# Patient Record
Sex: Female | Born: 1970 | Race: White | Hispanic: No | Smoking: Current every day smoker
Health system: Southern US, Community
[De-identification: ages and names within clinical notes are randomized; demographics above are authoritative.]

## PROBLEM LIST (undated history)

## (undated) DIAGNOSIS — F909 Attention-deficit hyperactivity disorder, unspecified type: Secondary | ICD-10-CM

## (undated) DIAGNOSIS — I10 Essential (primary) hypertension: Secondary | ICD-10-CM

## (undated) HISTORY — PX: KNEE SURGERY: SHX244

---

## 2006-07-11 ENCOUNTER — Ambulatory Visit: Admission: RE | Admit: 2006-07-11 | Discharge: 2006-07-11 | Payer: Self-pay | Admitting: Obstetrics and Gynecology

## 2006-07-11 IMAGING — US US OB DETAIL+14 WK
1 series · 14 of 28 positions shown · non-contrast
Comparison: none

OBSTETRICAL ULTRASOUND:
 This ultrasound was performed in The [HOSPITAL], and the [facility] OB[facility] report will be stored to [REDACTED] PACS.

[Series 1: us ob detail+14 wk · 14 of 104 slices shown]
[im 4/104]
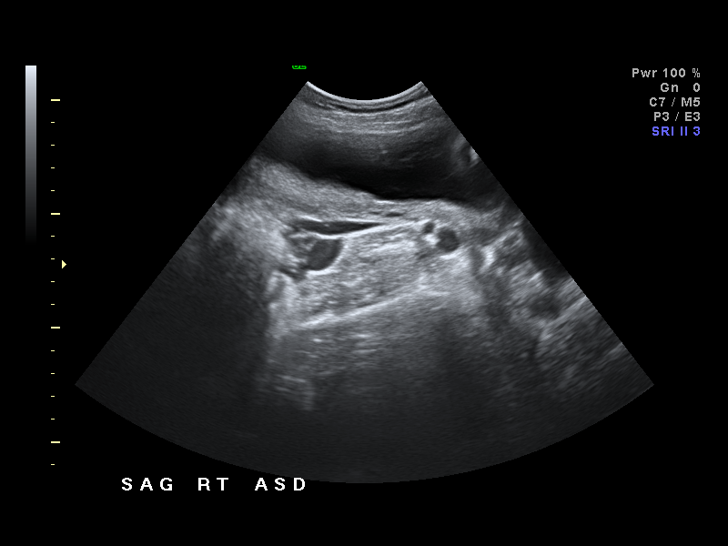
[im 12/104]
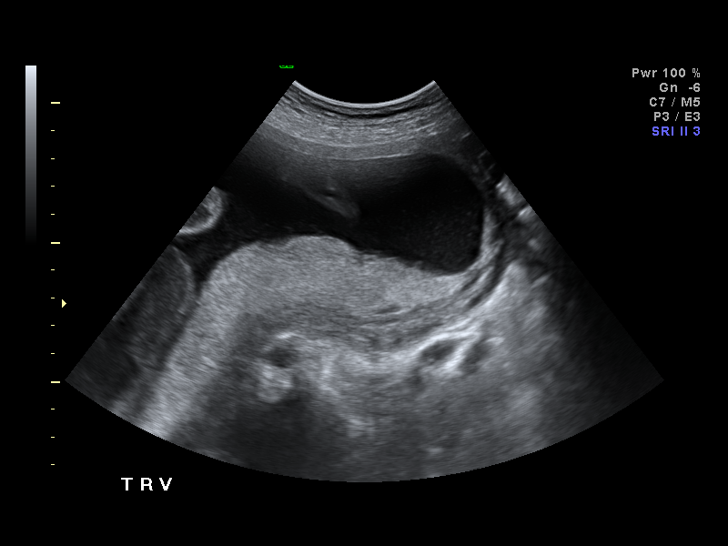
[im 20/104]
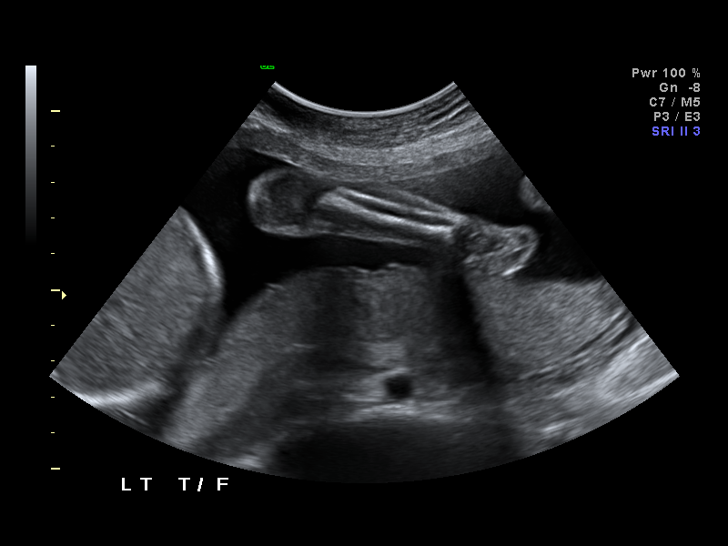
[im 27/104]
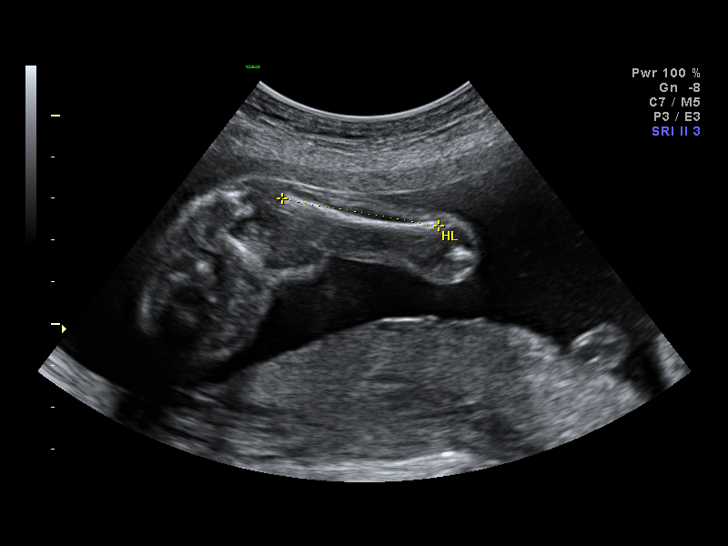
[im 35/104]
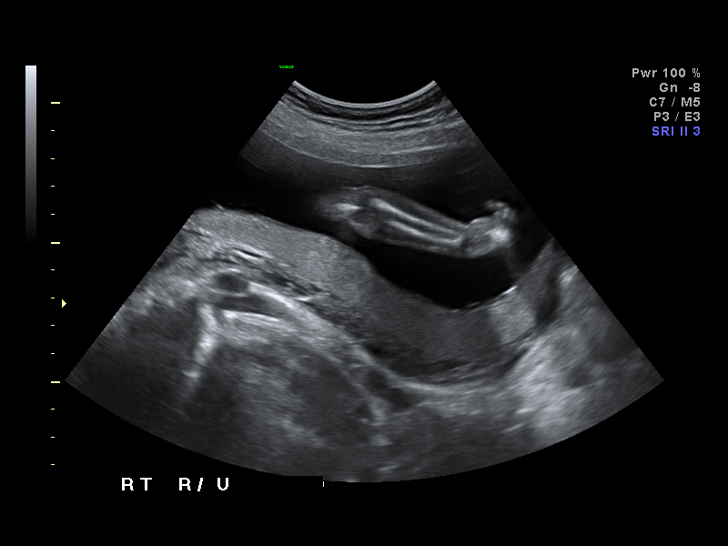
[im 42/104]
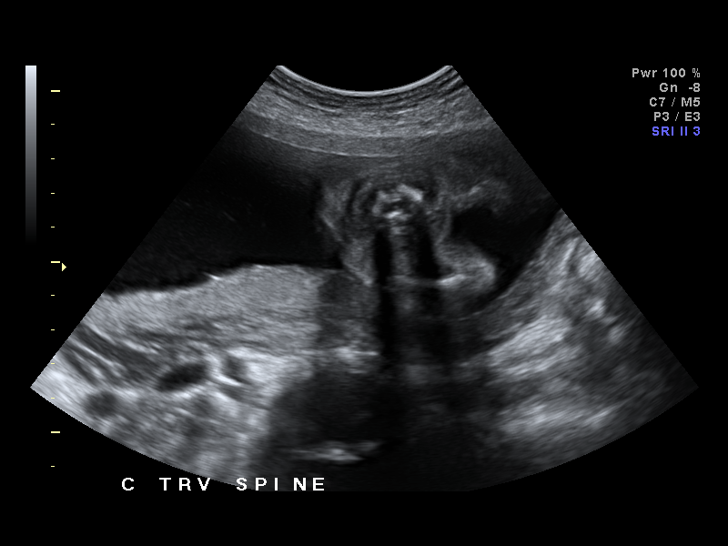
[im 50/104]
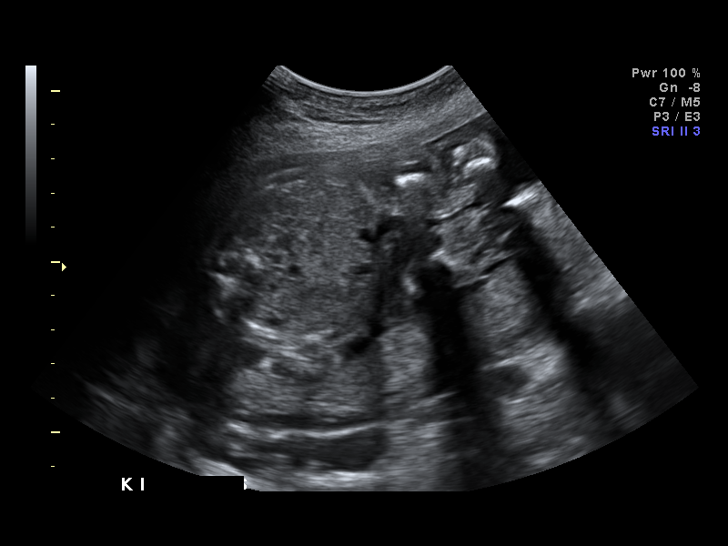
[im 58/104]
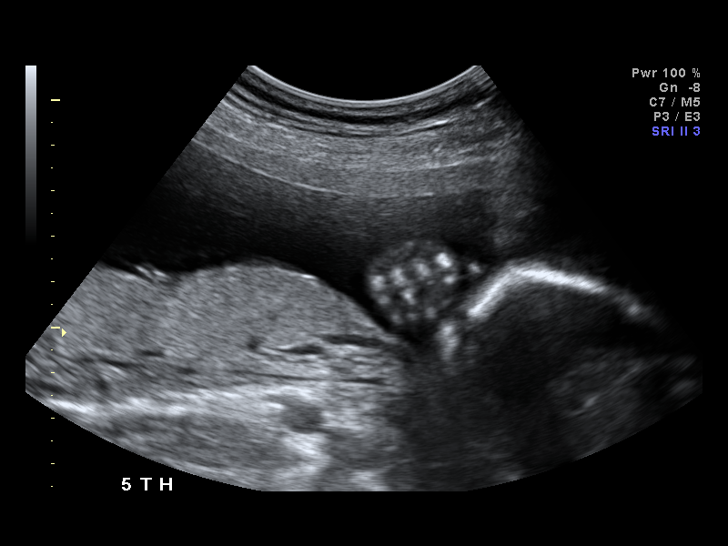
[im 65/104]
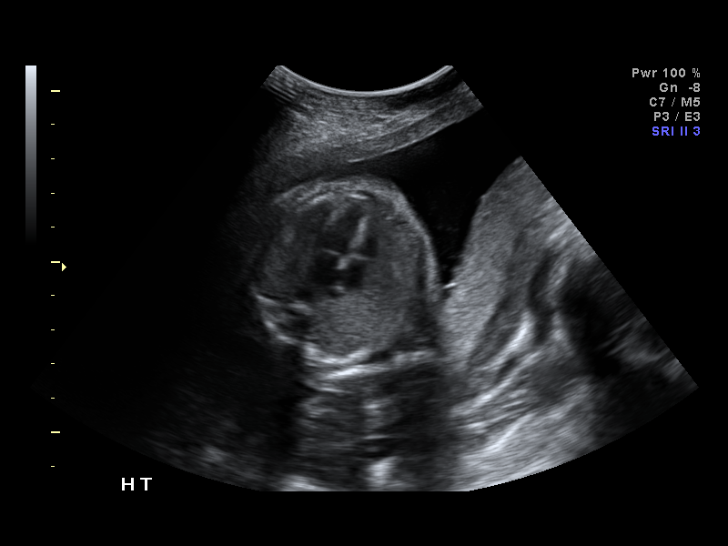
[im 73/104]
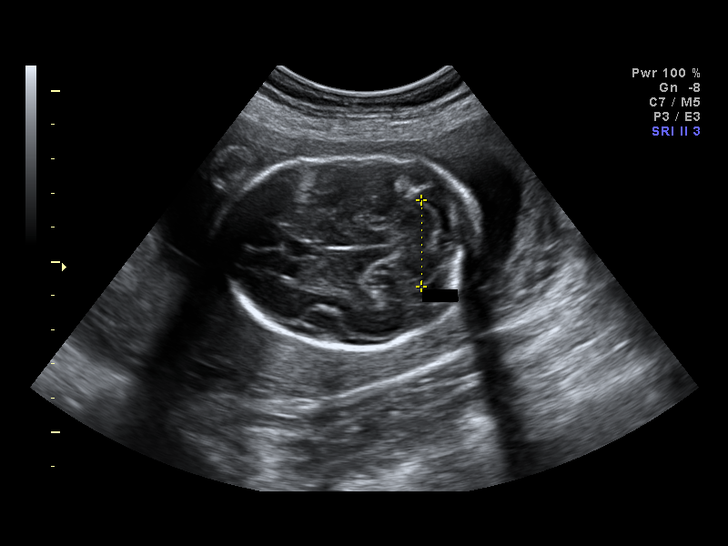
[im 81/104]
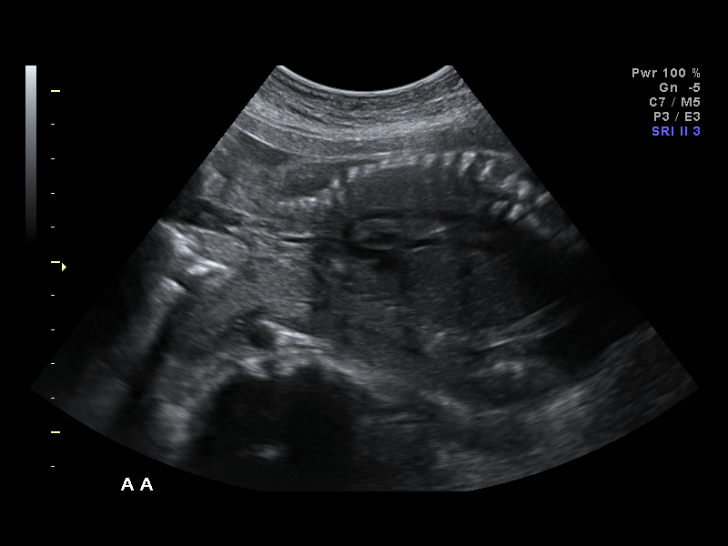
[im 88/104]
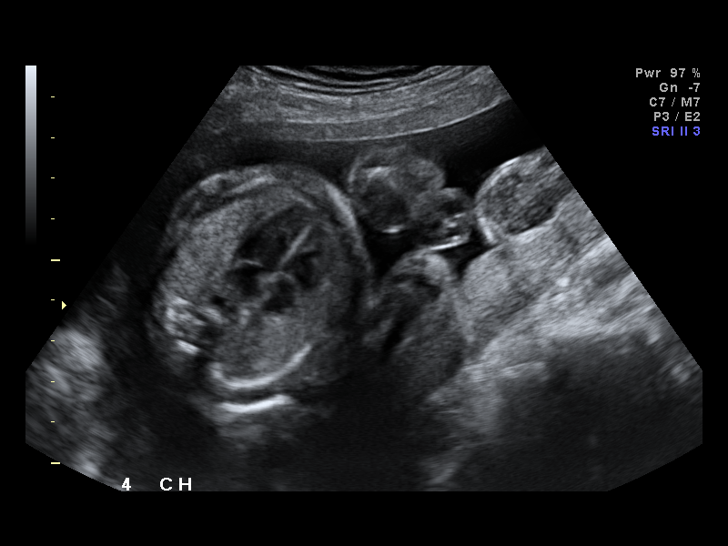
[im 96/104]
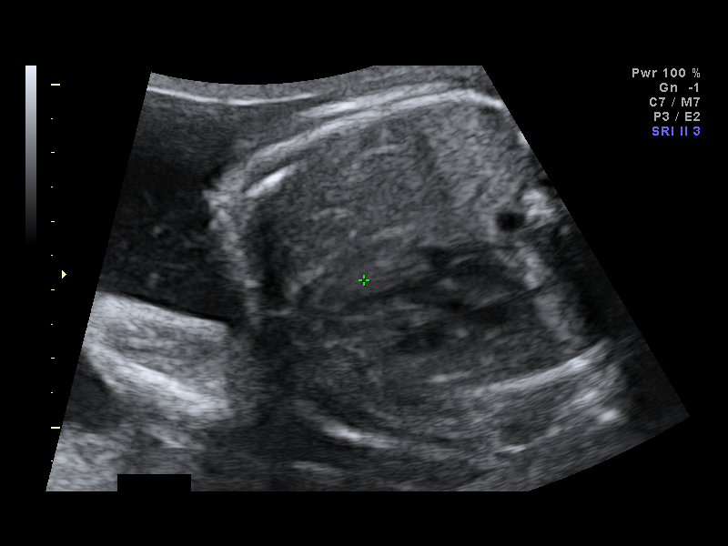
[im 104/104]
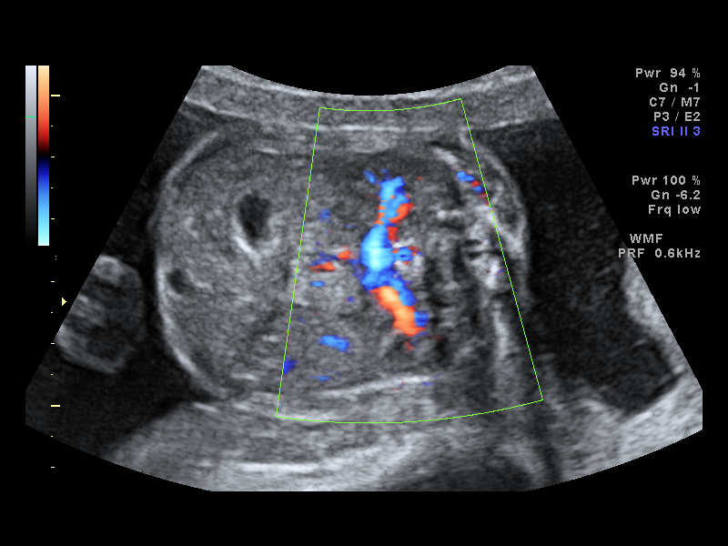

[14 of 28 positions shown; findings below may reference images not displayed]

IMPRESSION: The [facility] report has also been faxed to the ordering physician.

## 2006-09-12 ENCOUNTER — Ambulatory Visit (HOSPITAL_COMMUNITY): Admission: RE | Admit: 2006-09-12 | Discharge: 2006-09-12 | Payer: Self-pay | Admitting: Obstetrics and Gynecology

## 2013-07-10 ENCOUNTER — Emergency Department (HOSPITAL_COMMUNITY)
Admission: EM | Admit: 2013-07-10 | Discharge: 2013-07-11 | Disposition: A | Discharge status: Other Acute Inpatient Hospital | Payer: BC Managed Care – PPO | Attending: Emergency Medicine | Admitting: Emergency Medicine

## 2013-07-10 ENCOUNTER — Encounter (HOSPITAL_COMMUNITY): Payer: Self-pay | Admitting: Emergency Medicine

## 2013-07-10 DIAGNOSIS — R000 Tachycardia, unspecified: Secondary | ICD-10-CM | POA: Insufficient documentation

## 2013-07-10 DIAGNOSIS — R1110 Vomiting, unspecified: Secondary | ICD-10-CM | POA: Insufficient documentation

## 2013-07-10 DIAGNOSIS — F10229 Alcohol dependence with intoxication, unspecified: Secondary | ICD-10-CM | POA: Insufficient documentation

## 2013-07-10 DIAGNOSIS — Z975 Presence of (intrauterine) contraceptive device: Secondary | ICD-10-CM | POA: Insufficient documentation

## 2013-07-10 DIAGNOSIS — F411 Generalized anxiety disorder: Secondary | ICD-10-CM | POA: Insufficient documentation

## 2013-07-10 DIAGNOSIS — I10 Essential (primary) hypertension: Secondary | ICD-10-CM | POA: Insufficient documentation

## 2013-07-10 DIAGNOSIS — F10239 Alcohol dependence with withdrawal, unspecified: Secondary | ICD-10-CM

## 2013-07-10 DIAGNOSIS — Z9289 Personal history of other medical treatment: Secondary | ICD-10-CM

## 2013-07-10 DIAGNOSIS — R450 Nervousness: Secondary | ICD-10-CM | POA: Insufficient documentation

## 2013-07-10 DIAGNOSIS — F1010 Alcohol abuse, uncomplicated: Secondary | ICD-10-CM

## 2013-07-10 DIAGNOSIS — F10939 Alcohol use, unspecified with withdrawal, unspecified: Secondary | ICD-10-CM | POA: Insufficient documentation

## 2013-07-10 DIAGNOSIS — Z8659 Personal history of other mental and behavioral disorders: Secondary | ICD-10-CM | POA: Insufficient documentation

## 2013-07-10 HISTORY — DX: Essential (primary) hypertension: I10

## 2013-07-10 HISTORY — DX: Attention-deficit hyperactivity disorder, unspecified type: F90.9

## 2013-07-10 LAB — COMPREHENSIVE METABOLIC PANEL
ALT: 27 U/L (ref 0–35)
AST: 36 U/L (ref 0–37)
Albumin: 4.1 g/dL (ref 3.5–5.2)
Alkaline Phosphatase: 62 U/L (ref 39–117)
Calcium: 9 mg/dL (ref 8.4–10.5)
GFR calc Af Amer: 83 mL/min — ABNORMAL LOW (ref >90)
Potassium: 3.5 mEq/L (ref 3.5–5.1)
Sodium: 144 mEq/L (ref 135–145)
Total Protein: 7.2 g/dL (ref 6.0–8.3)

## 2013-07-10 LAB — ETHANOL: Alcohol, Ethyl (B): 394 mg/dL — ABNORMAL HIGH (ref 0–11)

## 2013-07-10 LAB — CBC WITH DIFFERENTIAL/PLATELET
Eosinophils Absolute: 0.2 10*3/uL (ref 0.0–0.7)
Lymphocytes Relative: 35 % (ref 12–46)
Lymphs Abs: 4.6 10*3/uL — ABNORMAL HIGH (ref 0.7–4.0)
MCH: 33.2 pg (ref 26.0–34.0)
Monocytes Relative: 4 % (ref 3–12)
Neutrophils Relative %: 60 % (ref 43–77)
Platelets: 238 10*3/uL (ref 150–400)
RBC: 4.46 MIL/uL (ref 3.87–5.11)
RDW: 14.9 % (ref 11.5–15.5)
WBC: 13.2 10*3/uL — ABNORMAL HIGH (ref 4.0–10.5)

## 2013-07-10 LAB — RAPID URINE DRUG SCREEN, HOSP PERFORMED
Amphetamines: NOT DETECTED
Benzodiazepines: NOT DETECTED
Cocaine: NOT DETECTED

## 2013-07-10 NOTE — ED Provider Notes (Signed)
Medical screening examination/treatment/procedure(s) were performed by non-physician practitioner and as supervising physician I was immediately available for consultation/collaboration.  EKG Interpretation   None         Gilda Crease, MD 07/10/13 2221

## 2013-07-10 NOTE — ED Notes (Signed)
Patient resting in position of comfort with eyes closed RR WNL--even and unlabored with equal rise and fall of chest Patient in NAD Side rails up, call bell in reach  

## 2013-07-10 NOTE — ED Notes (Signed)
Pt's boyfriend will be taking pt's belonging home with him

## 2013-07-10 NOTE — ED Notes (Signed)
Pt brought in by boyfriend. Pt requesting EtOH detox. Pt states that she has been drinking since teen years. Pt tearful. Per family pt has been binge drinking for last 5 days straight and has not been to work. Family found pt in motel room with vomit and fifth of vodka that was empty.
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
## 2013-07-10 NOTE — ED Notes (Signed)
Patient's boyfriend states that he is going home for the rest of the night Boyfriend took patient's belongings with him

## 2013-07-10 NOTE — ED Notes (Signed)
Assumed care of patient Patient here for ETOH detox, brought in by boyfriend who is at the bedside Patient has had issue with drinking since teenager Patient has attempted to detox before without success--NO hx of seizures when detox attempted previously Patient too intoxicated to ambulate to bathroom--placed on bedpan, urine specimen obtained Patient appears in NAD Call bell in reach and boyfriend remains at bedside

## 2013-07-11 ENCOUNTER — Encounter (HOSPITAL_COMMUNITY): Payer: Self-pay | Admitting: *Deleted

## 2013-07-11 ENCOUNTER — Encounter (HOSPITAL_COMMUNITY): Payer: Self-pay | Admitting: Emergency Medicine

## 2013-07-11 ENCOUNTER — Inpatient Hospital Stay (HOSPITAL_COMMUNITY)
Admission: AD | Admit: 2013-07-11 | Discharge: 2013-07-13 | DRG: 897 | Disposition: A | Discharge status: Home / Self Care | Payer: Federal, State, Local not specified - Other | Source: Intra-hospital | Attending: Psychiatry | Admitting: Psychiatry

## 2013-07-11 DIAGNOSIS — Z79899 Other long term (current) drug therapy: Secondary | ICD-10-CM

## 2013-07-11 DIAGNOSIS — F1010 Alcohol abuse, uncomplicated: Secondary | ICD-10-CM

## 2013-07-11 DIAGNOSIS — F909 Attention-deficit hyperactivity disorder, unspecified type: Secondary | ICD-10-CM | POA: Diagnosis present

## 2013-07-11 DIAGNOSIS — F10939 Alcohol use, unspecified with withdrawal, unspecified: Principal | ICD-10-CM | POA: Diagnosis present

## 2013-07-11 DIAGNOSIS — F1020 Alcohol dependence, uncomplicated: Secondary | ICD-10-CM | POA: Diagnosis present

## 2013-07-11 DIAGNOSIS — F329 Major depressive disorder, single episode, unspecified: Secondary | ICD-10-CM

## 2013-07-11 DIAGNOSIS — I10 Essential (primary) hypertension: Secondary | ICD-10-CM | POA: Diagnosis present

## 2013-07-11 DIAGNOSIS — F10239 Alcohol dependence with withdrawal, unspecified: Principal | ICD-10-CM | POA: Diagnosis present

## 2013-07-11 MED ORDER — LOPERAMIDE HCL 2 MG PO CAPS
2.0000 mg | ORAL_CAPSULE | ORAL | Status: DC | PRN
  Filled 2013-07-11: qty 1

## 2013-07-11 MED ORDER — LORAZEPAM 1 MG PO TABS: 0.0000 mg | ORAL_TABLET | Freq: Two times a day (BID) | ORAL | Status: DC

## 2013-07-11 MED ORDER — NICOTINE 14 MG/24HR TD PT24
14.0000 mg | MEDICATED_PATCH | Freq: Every day | TRANSDERMAL | Status: DC
  Administered 2013-07-11 – 2013-07-12 (×2): 14 mg via TRANSDERMAL
  Filled 2013-07-11 (×5): qty 1

## 2013-07-11 MED ORDER — CHLORDIAZEPOXIDE HCL 25 MG PO CAPS
25.0000 mg | ORAL_CAPSULE | Freq: Four times a day (QID) | ORAL | Status: DC
  Administered 2013-07-11 (×2): 25 mg via ORAL
  Filled 2013-07-11 (×2): qty 1

## 2013-07-11 MED ORDER — CHLORDIAZEPOXIDE HCL 25 MG PO CAPS: 25.0000 mg | ORAL_CAPSULE | Freq: Every day | ORAL | Status: DC

## 2013-07-11 MED ORDER — BISOPROLOL-HYDROCHLOROTHIAZIDE 2.5-6.25 MG PO TABS
1.0000 | ORAL_TABLET | Freq: Every day | ORAL | Status: DC
  Administered 2013-07-13: 1 via ORAL
  Filled 2013-07-11: qty 5
  Filled 2013-07-11 (×3): qty 1

## 2013-07-11 MED ORDER — VITAMIN B-1 100 MG PO TABS: 100.0000 mg | ORAL_TABLET | Freq: Every day | ORAL | Status: DC

## 2013-07-11 MED ORDER — LOPERAMIDE HCL 2 MG PO CAPS: 2.0000 mg | ORAL_CAPSULE | ORAL | Status: DC | PRN

## 2013-07-11 MED ORDER — MAGNESIUM HYDROXIDE 400 MG/5ML PO SUSP: 30.0000 mL | Freq: Every day | ORAL | Status: DC | PRN

## 2013-07-11 MED ORDER — THIAMINE HCL 100 MG/ML IJ SOLN
100.0000 mg | Freq: Once | INTRAMUSCULAR | Status: AC
  Administered 2013-07-11: 200 mg via INTRAMUSCULAR
  Filled 2013-07-11: qty 2

## 2013-07-11 MED ORDER — NICOTINE 21 MG/24HR TD PT24
21.0000 mg | MEDICATED_PATCH | Freq: Every day | TRANSDERMAL | Status: DC
  Filled 2013-07-11: qty 1

## 2013-07-11 MED ORDER — LORAZEPAM 1 MG PO TABS: 0.0000 mg | ORAL_TABLET | Freq: Four times a day (QID) | ORAL | Status: DC

## 2013-07-11 MED ORDER — CHLORDIAZEPOXIDE HCL 25 MG PO CAPS
25.0000 mg | ORAL_CAPSULE | Freq: Three times a day (TID) | ORAL | Status: DC
  Filled 2013-07-11 (×2): qty 1

## 2013-07-11 MED ORDER — CHLORDIAZEPOXIDE HCL 25 MG PO CAPS: 25.0000 mg | ORAL_CAPSULE | ORAL | Status: DC

## 2013-07-11 MED ORDER — ADULT MULTIVITAMIN W/MINERALS CH
1.0000 | ORAL_TABLET | Freq: Every day | ORAL | Status: DC
  Administered 2013-07-12 – 2013-07-13 (×2): 1 via ORAL
  Filled 2013-07-11 (×4): qty 1

## 2013-07-11 MED ORDER — HYDROXYZINE HCL 25 MG PO TABS
25.0000 mg | ORAL_TABLET | Freq: Four times a day (QID) | ORAL | Status: DC | PRN
  Administered 2013-07-12: 25 mg via ORAL
  Filled 2013-07-11 (×2): qty 1

## 2013-07-11 MED ORDER — VITAMIN B-1 100 MG PO TABS
100.0000 mg | ORAL_TABLET | Freq: Every day | ORAL | Status: DC
  Administered 2013-07-12 – 2013-07-13 (×2): 100 mg via ORAL
  Filled 2013-07-11 (×4): qty 1

## 2013-07-11 MED ORDER — ACETAMINOPHEN 325 MG PO TABS
650.0000 mg | ORAL_TABLET | Freq: Four times a day (QID) | ORAL | Status: DC | PRN
  Administered 2013-07-11: 650 mg via ORAL
  Filled 2013-07-11: qty 2

## 2013-07-11 MED ORDER — ONDANSETRON 4 MG PO TBDP: 4.0000 mg | ORAL_TABLET | Freq: Four times a day (QID) | ORAL | Status: DC | PRN

## 2013-07-11 MED ORDER — CHLORDIAZEPOXIDE HCL 25 MG PO CAPS
25.0000 mg | ORAL_CAPSULE | Freq: Four times a day (QID) | ORAL | Status: DC | PRN
  Administered 2013-07-11: 25 mg via ORAL
  Filled 2013-07-11: qty 1

## 2013-07-11 MED ORDER — CHLORDIAZEPOXIDE HCL 25 MG PO CAPS
25.0000 mg | ORAL_CAPSULE | Freq: Four times a day (QID) | ORAL | Status: AC
  Administered 2013-07-11 – 2013-07-12 (×3): 25 mg via ORAL
  Filled 2013-07-11 (×4): qty 1

## 2013-07-11 MED ORDER — ALUM & MAG HYDROXIDE-SIMETH 200-200-20 MG/5ML PO SUSP: 30.0000 mL | ORAL | Status: DC | PRN

## 2013-07-11 MED ORDER — HYDROXYZINE HCL 25 MG PO TABS
25.0000 mg | ORAL_TABLET | Freq: Four times a day (QID) | ORAL | Status: DC | PRN
  Administered 2013-07-11: 25 mg via ORAL
  Filled 2013-07-11: qty 1

## 2013-07-11 MED ORDER — ADULT MULTIVITAMIN W/MINERALS CH
1.0000 | ORAL_TABLET | Freq: Every day | ORAL | Status: DC
  Administered 2013-07-11: 1 via ORAL
  Filled 2013-07-11: qty 1

## 2013-07-11 MED ORDER — ONDANSETRON 4 MG PO TBDP
4.0000 mg | ORAL_TABLET | Freq: Four times a day (QID) | ORAL | Status: DC | PRN
  Filled 2013-07-11: qty 1

## 2013-07-11 MED ORDER — NICOTINE 21 MG/24HR TD PT24
21.0000 mg | MEDICATED_PATCH | Freq: Every day | TRANSDERMAL | Status: DC
  Administered 2013-07-11: 21 mg via TRANSDERMAL
  Filled 2013-07-11: qty 1

## 2013-07-11 MED ORDER — CHLORDIAZEPOXIDE HCL 25 MG PO CAPS
50.0000 mg | ORAL_CAPSULE | Freq: Once | ORAL | Status: AC
  Administered 2013-07-11: 50 mg via ORAL
  Filled 2013-07-11: qty 2

## 2013-07-11 MED ORDER — ACETAMINOPHEN 325 MG PO TABS: 650.0000 mg | ORAL_TABLET | Freq: Four times a day (QID) | ORAL | Status: DC | PRN

## 2013-07-11 MED ORDER — CHLORDIAZEPOXIDE HCL 25 MG PO CAPS: 25.0000 mg | ORAL_CAPSULE | Freq: Four times a day (QID) | ORAL | Status: DC | PRN

## 2013-07-11 MED ORDER — BISOPROLOL-HYDROCHLOROTHIAZIDE 2.5-6.25 MG PO TABS
1.0000 | ORAL_TABLET | Freq: Every day | ORAL | Status: DC
  Administered 2013-07-11: 1 via ORAL
  Filled 2013-07-11: qty 1

## 2013-07-11 MED ORDER — CHLORDIAZEPOXIDE HCL 25 MG PO CAPS: 25.0000 mg | ORAL_CAPSULE | Freq: Three times a day (TID) | ORAL | Status: DC
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
## 2013-07-11 NOTE — ED Notes (Signed)
Patient resting in position of comfort with eyes closed RR WNL--even and unlabored with equal rise and fall of chest Patient in NAD Side rails up, call bell in reach  

## 2013-07-11 NOTE — ED Notes (Signed)
Notified [facility] of transportation needed to [facility]. 

## 2013-07-11 NOTE — Tx Team (Signed)
Initial Interdisciplinary Treatment Plan  PATIENT STRENGTHS: (choose at least two) Ability for insight Average or above average intelligence Capable of independent living General fund of knowledge Physical Health Religious Affiliation Supportive family/friends Work skills  PATIENT STRESSORS: Substance abuse   PROBLEM LIST: Problem List/Patient Goals Date to be addressed Date deferred Reason deferred Estimated date of resolution  Substance abuse 07/11/13                                                      DISCHARGE CRITERIA:  Improved stabilization in mood, thinking, and/or behavior Medical problems require only outpatient monitoring Motivation to continue treatment in a less acute level of care Need for constant or close observation no longer present Verbal commitment to aftercare and medication compliance Withdrawal symptoms are absent or subacute and managed without 24-hour nursing intervention  PRELIMINARY DISCHARGE PLAN: Attend aftercare/continuing care group Attend 12-step recovery group Return to previous living arrangement Return to previous work or school arrangements  PATIENT/FAMIILY INVOLVEMENT: This treatment plan has been presented to and reviewed with the patient, Carla Caldwell, and/or family member,   The patient and family have been given the opportunity to ask questions and make suggestions.  Beatrix Shipper 07/11/2013, 5:35 PM

## 2013-07-11 NOTE — ED Notes (Signed)
Patient tearful and anxious. Denies SI, HI, AVH. C/o mild nausea and headache, anxiety and agitation 10/10, and slight chill from sweat. Patient states "I am angry at myself but I would not kill myself, I love my child". Patient concerned about her child getting to school. Patient agitated that she cannot call her boyfriend. Refuses to sign belongings form because she cannot remember her boyfriend leaving with her belongings.  Carla Caldwell has patient's child. Patient's boyfriend Carla Caldwell [phone] has contact information for [name].  Patient remains tearful. Encouragement offered.   Patient safety maintained. Q 15 minute checks continue.

## 2013-07-11 NOTE — Progress Notes (Signed)
Ms. Rosendo Gros [phone].

## 2013-07-11 NOTE — ED Notes (Signed)
Patient awake and upset, crying--anxious that her boyfriend left and went home while she was asleep Patient asking about belongings--doesn't remember that her boyfriend took her belongings home with him Patient reminded that her boyfriend has her belongings Patient stated "I want my boyfriend back here. I need him here. I want my belongings." Patient informed that even if her boyfriend did return to ED with her belongings, that per policy, she would not be allowed to have them at bedside  Patient then stated "I don't care. I want my boyfriend to come back. I need him to come back." Will inform MD and charge nurse of above

## 2013-07-11 NOTE — ED Notes (Signed)
Patient awake--given ice water, sandwich, cheese and apple sauce Patient denies c/o pain or further needs at this time

## 2013-07-11 NOTE — ED Notes (Signed)
[facility] here to transport to [facility]. No complaints voiced, denies pain. No belongings. Mental health tech escorted to [facility].

## 2013-07-11 NOTE — Consult Note (Signed)
  Psychiatric Specialty Exam: Physical Exam  ROS  Blood pressure 120/71, pulse 101, temperature 97.9 F (36.6 C), temperature source Oral, resp. rate 20, last menstrual period 06/26/2013, SpO2 97.00%.There is no height or weight on file to calculate BMI.  General Appearance: Fairly Groomed  Patent attorney::  Good  Speech:  Clear and Coherent  Volume:  Normal  Mood:  Depressed  Affect:  Appropriate  Thought Process:  Coherent and Logical  Orientation:  Full (Time, Place, and Person)  Thought Content:  Negative  Suicidal Thoughts:  No  Homicidal Thoughts:  No  Memory:  Immediate;   Good Recent;   Good Remote;   Good  Judgement:  Good  Insight:  Good  Psychomotor Activity:  Normal  Concentration:  Good  Recall:  Good  Akathisia:  Negative  Handed:  Right  AIMS (if indicated):     Assets:  Communication Skills Desire for Improvement Financial Resources/Insurance Housing Leisure Time Physical Health Social Support Talents/Skills Transportation Vocational/Educational  Sleep:   adequate  Carla Caldwell says she is a binge drinker.  She has been drinking 2 weeks this time, more for the last couple of days.  She is depressed but does not want to take anti-depressants as the side effects are worse than the depression, she says.  She was going to AA but said it was a trigger for drinking, she said.  She does see a therapist.  Recommend transfer to an inpatient bed for alcohol detox and treatment of depression.

## 2013-07-11 NOTE — ED Notes (Signed)
Patient to go to Psych ED Will call report and move patient
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
## 2013-07-11 NOTE — ED Notes (Signed)
NP at bedside to speak with patient

## 2013-07-11 NOTE — [facility] Counselor (Signed)
Per Tanna Savoy at [facility], pt has been accepted to [facility] to [place].   Evette Cristal, [place] Assessment Counselor

## 2013-07-11 NOTE — Progress Notes (Signed)
Adult Psychoeducational Group Note  Date:  07/11/2013 Time:  9:57 PM  Group Topic/Focus:  NA MEETING  Participation Level:  Active  Participation Quality:  Appropriate  Affect:  Appropriate  Cognitive:  Appropriate  Insight: Appropriate  Engagement in Group:  Engaged  Modes of Intervention:  Support  Additional Comments:  PT attended and participated in NA meeting   Rajah Tagliaferro 07/11/2013, 9:57 PM

## 2013-07-12 ENCOUNTER — Encounter (HOSPITAL_COMMUNITY): Payer: Self-pay | Admitting: Psychiatry

## 2013-07-12 DIAGNOSIS — F321 Major depressive disorder, single episode, moderate: Secondary | ICD-10-CM

## 2013-07-12 DIAGNOSIS — F411 Generalized anxiety disorder: Secondary | ICD-10-CM

## 2013-07-12 DIAGNOSIS — F1020 Alcohol dependence, uncomplicated: Secondary | ICD-10-CM

## 2013-07-12 MED ORDER — NICOTINE POLACRILEX 2 MG MT GUM
2.0000 mg | CHEWING_GUM | OROMUCOSAL | Status: DC | PRN
  Administered 2013-07-12 – 2013-07-13 (×2): 2 mg via ORAL
  Filled 2013-07-12 (×2): qty 1

## 2013-07-12 NOTE — Progress Notes (Signed)
Recreation Therapy Notes  Date: 12.04.2014 Time: 3:00pm Location: [address] Dayroom   Group Topic: Communication, Team Building, Problem Solving  Goal Area(s) Addresses:  Patient will effectively work with peer towards shared goal.  Patient will identify skill used to make activity successful.  Patient will identify how skills used during activity can be used to reach post d/c goals.   Behavioral Response: Engaged, Attentive, Appropriate   Intervention: Problem Solving Activitiy  Activity: Life Boat. Patients were given a scenario about being on a sinking yacht. Patients were informed the yacht included 15 guest, 8 of which could be placed on the life boat, along with all group members. Individuals on guest list were of varying socioeconomic classes such as a Education officer, museum, Materials engineer, Midwife, Tree surgeon.   Education: Special educational needs teacher, Team Work, Scientist, physiological, Journalist, newspaper, Discharge Planning   Education Outcome: Acknowledges understanding  Clinical Observations/Feedback: Patient actively engaged in group activity, voicing her opinion and debating with group members appropriately. Patient identified qualities that guided her decision making and related theses skills to her recovery process. Patient additionally group skills used, problem solving and communication and identify how each of these skills will be useful during her recovery process.   Marykay Lex Yasha Tibbett, LRT/CTRS  Jearl Klinefelter 07/12/2013 3:55 PM

## 2013-07-12 NOTE — Progress Notes (Signed)
Adult Psychoeducational Group Note  Date:  07/12/2013 Time:  7:16 PM  Group Topic/Focus:  Overcoming Stress:   The focus of this group is to define stress and help patients assess their triggers.  Participation Level:  Active  Participation Quality:  Appropriate  Affect:  Appropriate  Cognitive:  Alert and Appropriate  Insight: Appropriate and Good  Engagement in Group:  Engaged  Modes of Intervention:  Confrontation, Education, Socialization and Support  Additional Comments:  Pt was very active and verbal in the stress group discussion.  Reynolds Bowl 07/12/2013, 7:16 PM
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
## 2013-07-12 NOTE — [facility] Group Notes (Signed)
[facility] LCSW Group Therapy  07/12/2013  1:15 PM   Type of Therapy:  Group Therapy  Participation Level:  Active  Participation Quality:  Attentive, Sharing and Supportive  Affect:  Calm and Appropriate  Cognitive:  Alert and Oriented  Insight:  Developing/Improving and Engaged  Engagement in Therapy:  Developing/Improving and Engaged  Modes of Intervention:  Clarification, Confrontation, Discussion, Education, Exploration, Limit-setting, Orientation, Problem-solving, Rapport Building, Dance movement psychotherapist, Socialization and Support  Summary of Progress/Problems: The topic for group was balance in life.  Today's group focused on defining balance in one's own words, identifying things that can knock one off balance, and exploring healthy ways to maintain balance in life. Group members were asked to provide an example of a time when they felt off balance, describe how they handled that situation,and process healthier ways to regain balance in the future. Group members were asked to share the most important tool for maintaining balance that they learned while at [facility] and how they plan to apply this method after discharge.  Pt shared that a balanced life for her means to be able to do everything and not get overwhelmed, but still have time for herself.  Pt participated in discussion of feeling guilty for taking "me time".  Pt shared that her life becomes unbalanced when she can't do everything and binge drinks to cope.  Pt states that she plans to attend AA meetings and go to outpatient therapy upon discharge, to restore balance in his life.  Pt actively participated and was engaged in group discussion.    Reyes Ivan, LCSW 07/12/2013 2:18 PM
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
## 2013-07-12 NOTE — [facility] Suicide Risk Assessment (Signed)
[facility] INPATIENT: Family/Significant Other Suicide Prevention Education   Suicide Prevention Education:  Education Completed; No one has been identified by the patient as the family member/significant other with whom the patient will be residing, and identified as the person(s) who will aid the patient in the event of a mental health crisis (suicidal ideations/suicide attempt).   Pt did not c/o SI at admission, nor have they endorsed SI during their stay here. SPE not required. SPI pamphlet provided to pt and he was encouraged to share information with his support network, ask questions, and talk about any concerns.   The suicide prevention education provided includes the following:  Suicide risk factors  Suicide prevention and interventions  National Suicide Hotline telephone number  [facility] assessment telephone number  [facility] Emergency Assistance 911  [facility] and/or Residential Mobile Crisis Unit telephone number  Reyes Ivan, [place] 07/12/2013  10:09 AM

## 2013-07-12 NOTE — Progress Notes (Signed)
D: Patient resting in bed with eyes closed.  Respirations even and unlabored.  Patient appears to be in no apparent distress. A: Staff to monitor Q 15 mins for safety.   R:Patient remains safe on the unit.  

## 2013-07-12 NOTE — Progress Notes (Signed)
[facility] Group Notes:  (Nursing/MHT/Case Management/Adjunct)  Date:  07/12/2013  Time:  9:47 AM  Type of Therapy:  Nurse Education  Participation Level:  Active  Participation Quality:  Appropriate and Attentive  Affect:  Appropriate  Cognitive:  Alert and Appropriate  Insight:  Appropriate and Good  Engagement in Group:  Engaged  Modes of Intervention:  Activity, Discussion, Education, Exploration, Orientation, Socialization and Support  Summary of Progress/Problems: Pt plans on making herself stick to her routine and trying to be happy until she feels happy and having a positive attitude. Pt made a daily goal of trying to make someone laugh or smile today.  Cathlean Cower 07/12/2013, 9:47 AM

## 2013-07-12 NOTE — Progress Notes (Signed)
Patient did not attend the evening karaoke group. Pt remained in her room during group.
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
## 2013-07-12 NOTE — Progress Notes (Addendum)
Patient ID: Carla Caldwell, female   DOB: 1971-02-10, 42 y.o.   MRN: [id number] Pt attended the 10am AA meeting.

## 2013-07-12 NOTE — Progress Notes (Signed)
D:  Patient up and active in the milieu this evening.  Appetite good.  Denies suicidal thoughts.  Denies withdrawal symptoms and has declined Librium.   A:  CIWA assessments done as ordered.  Medications given as ordered.  Educated patient about withdrawal symptoms and encouraged her to notify nursing staff if she was having any symptoms.   R:  Verbalized understanding of withdrawal symptoms.  States she will let staff know if she is having any symptoms.  Safety is maintained.

## 2013-07-13 MED ORDER — BISOPROLOL-HYDROCHLOROTHIAZIDE 2.5-6.25 MG PO TABS: 1.0000 | ORAL_TABLET | Freq: Every day | ORAL | Status: AC

## 2013-07-13 NOTE — Progress Notes (Signed)
[facility] Adult Case Management Discharge Plan :  Will you be returning to the same living situation after discharge: Yes,  returning home At discharge, do you have transportation home?:Yes,  a friend will pick pt up Do you have the ability to pay for your medications:Yes,  access to meds  Release of information consent forms completed and in the chart;  Patient's signature needed at discharge.  Patient to Follow up at: Follow-up Information   Follow up with [facility] On 07/18/2013. (Appointment scheduled at 10:30 am with Michela Pitcher for hospital discharge appointment.  They will than schedule you for medication management and therapy.  )    Contact information:   [address] [place], [place] [address] Phone: [phone] Fax: [phone]      Patient denies SI/HI:   Yes,  denies SI/HI    Safety Planning and Suicide Prevention discussed:  Yes,  discussed with pt.  N/A to contact family or friend due to no SI on admission.  See suicide prevention education note.   Carmina Miller 07/13/2013, 11:47 AM

## 2013-07-13 NOTE — Tx Team (Signed)
Interdisciplinary Treatment Plan Update (Adult)  Date: 07/13/2013  Time Reviewed:  9:45 AM  Progress in Treatment: Attending groups: Yes Participating in groups:  Yes Taking medication as prescribed:  Yes Tolerating medication:  Yes Family/Significant othe contact made: No, n/a Patient understands diagnosis:  Yes Discussing patient identified problems/goals with staff:  Yes Medical problems stabilized or resolved:  Yes Denies suicidal/homicidal ideation: Yes Issues/concerns per patient self-inventory:  Yes Other:  New problem(s) identified: N/A  Discharge Plan or Barriers: Pt will follow up at [facility] for medication management and therapy.    Reason for Continuation of Hospitalization: Stable to d/c today  Comments: N/A  Estimated length of stay: D/C today  For review of initial/current patient goals, please see plan of care.  Attendees: Patient:     Family:     Physician:  Dr. Dub Mikes 07/13/2013 10:08 AM   Nursing:   Enzo Bi, RN 07/13/2013 10:08 AM   Clinical Social Worker:  Reyes Ivan, LCSW 07/13/2013 10:08 AM   Other: Onnie Boer, RN case manager 07/13/2013 10:08 AM   Other:  Trula Slade, LCSWA 07/13/2013 10:08 AM   Other:     Other:     Other:    Other:    Other:    Other:    Other:    Other:     Scribe for Treatment Team:   Carmina Miller, 07/13/2013 , 10:08 AM

## 2013-07-13 NOTE — BHH Group Notes (Signed)
[facility] LCSW Aftercare Discharge Planning Group Note   07/13/2013 8:45 AM  Participation Quality:  Alert and Appropriate   Mood/Affect: Calm and Appropriate  Depression Rating:  0  Anxiety Rating: 0    Thoughts of Suicide:  Pt denies SI/HI  Will you contract for safety?   Yes  Current AVH:  Pt denies  Plan for Discharge/Comments:  Pt attended discharge planning group and actively participated in group.  CSW provided pt with today's workbook.  Pt reports feeling stable to d/c today.  Pt states that she will return home in [place], [place] and follow up at [facility] for medication management and therapy.  No further needs voiced by pt at this time.     Transportation Means: Pt reports access to transportation - friend will pick pt up  Supports: No supports mentioned at this time  Reyes Ivan, LCSW 07/13/2013 10:33 AM

## 2013-07-13 NOTE — BHH Suicide Risk Assessment (Signed)
Suicide Risk Assessment  Discharge Assessment     Demographic Factors: Caucasian   Mental Status Per Nursing Assessment::   On Admission:  NA  Current Mental Status by Physician: In full contact with reality. There are no suicidal ideas, plans or intent. Her mood is euthymic, her affect is appropriate. She states she is committed to abstinence. She wants to continue to work on her coping skills so she can better manage her stress. She is relieved to have found out she still has her job. She is already moved into her new place. She is planning to resume exercise and put time aside to nurture herself   Loss Factors: NA  Historical Factors: NA  Risk Reduction Factors:   Responsible for children under 39 years of age, Sense of responsibility to family, Employed and Positive social support  Continued Clinical Symptoms:  Alcohol/Substance Abuse/Dependencies  Cognitive Features That Contribute To Risk:  Polarized thinking Thought constriction (tunnel vision)    Suicide Risk:  Minimal: No identifiable suicidal ideation.  Patients presenting with no risk factors but with morbid ruminations; may be classified as minimal risk based on the severity of the depressive symptoms  Discharge Diagnoses:   AXIS I:  Alcohol Abuse, ADHD, Anxiety Disorder NOS AXIS II:  Deferred AXIS III:   Past Medical History  Diagnosis Date  . Hypertension   . ADHD (attention deficit hyperactivity disorder)    AXIS IV:  problems with primary support group AXIS V:  61-70 mild symptoms  Plan Of Care/Follow-up recommendations:  Activity:  as tolerated Diet:  regular Follow up outpatient basis/CD IOP/AA Is patient on multiple antipsychotic therapies at discharge:  No   Has Patient had three or more failed trials of antipsychotic monotherapy by history:  No  Recommended Plan for Multiple Antipsychotic Therapies: NA  Ansh Fauble A 07/13/2013, 12:39 PM
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
## 2013-07-13 NOTE — Progress Notes (Signed)
Patient ID: Carla Caldwell, female   DOB: October 12, 1970, 42 y.o.   MRN: [id number] D. Patient presents with euthymic mood. She states '' I'm doing great. I'm ready to go home. I'm following up with my therapy and I'm going to do AA. I've gotten some good support here '' Orders received for pending discharge, pt verbalized understanding. No further voiced concerns at this time. Pt discharge instructions reviewed, avs copy provided, and signed. Rx given, as well as free medication supply.Letter provided for work. Crisis services reviewed. Pt verbalized understanding, opportunity for questions provided. Pt verbalized understanding. Pt denies SI/HI, no signs of decompensation. All belongings returned.  Pt escorted from unit to lobby,to care of boyfriend.

## 2013-07-18 NOTE — Progress Notes (Signed)
Patient Discharge Instructions:  After Visit Summary (AVS):   Faxed to:  07/18/13 Discharge Summary Note:   Faxed to:  07/18/13 Psychiatric Admission Assessment Note:   Faxed to:  07/18/13 Suicide Risk Assessment - Discharge Assessment:   Faxed to:  07/18/13 Faxed/Sent to the Next Level Care provider:  07/18/13 Faxed to [facility] @ [phone]  Jerelene Redden, 07/18/2013, 2:37 PM
